# Patient Record
Sex: Male | Born: 1965 | Race: White | Hispanic: No | Marital: Married | Smoking: Former smoker
Health system: Southern US, Community
[De-identification: ages and names within clinical notes are randomized; demographics above are authoritative.]

## PROBLEM LIST (undated history)

## (undated) DIAGNOSIS — N400 Enlarged prostate without lower urinary tract symptoms: Secondary | ICD-10-CM

## (undated) HISTORY — DX: Benign prostatic hyperplasia without lower urinary tract symptoms: N40.0

---

## 2004-08-12 HISTORY — PX: APPENDECTOMY: SHX54

## 2005-03-12 ENCOUNTER — Ambulatory Visit: Payer: Self-pay | Admitting: General Surgery

## 2005-03-14 ENCOUNTER — Inpatient Hospital Stay: Payer: Self-pay | Admitting: General Surgery

## 2006-08-08 IMAGING — CR DG ABDOMEN 3V
1 series · 3 of 3 positions shown · non-contrast
Comparison: none

REASON FOR EXAM: Abdominal distention
COMMENTS:

[Series 1: view not recorded · 0.17mm/px · 3 of 3 slices shown]
[im 1/3]
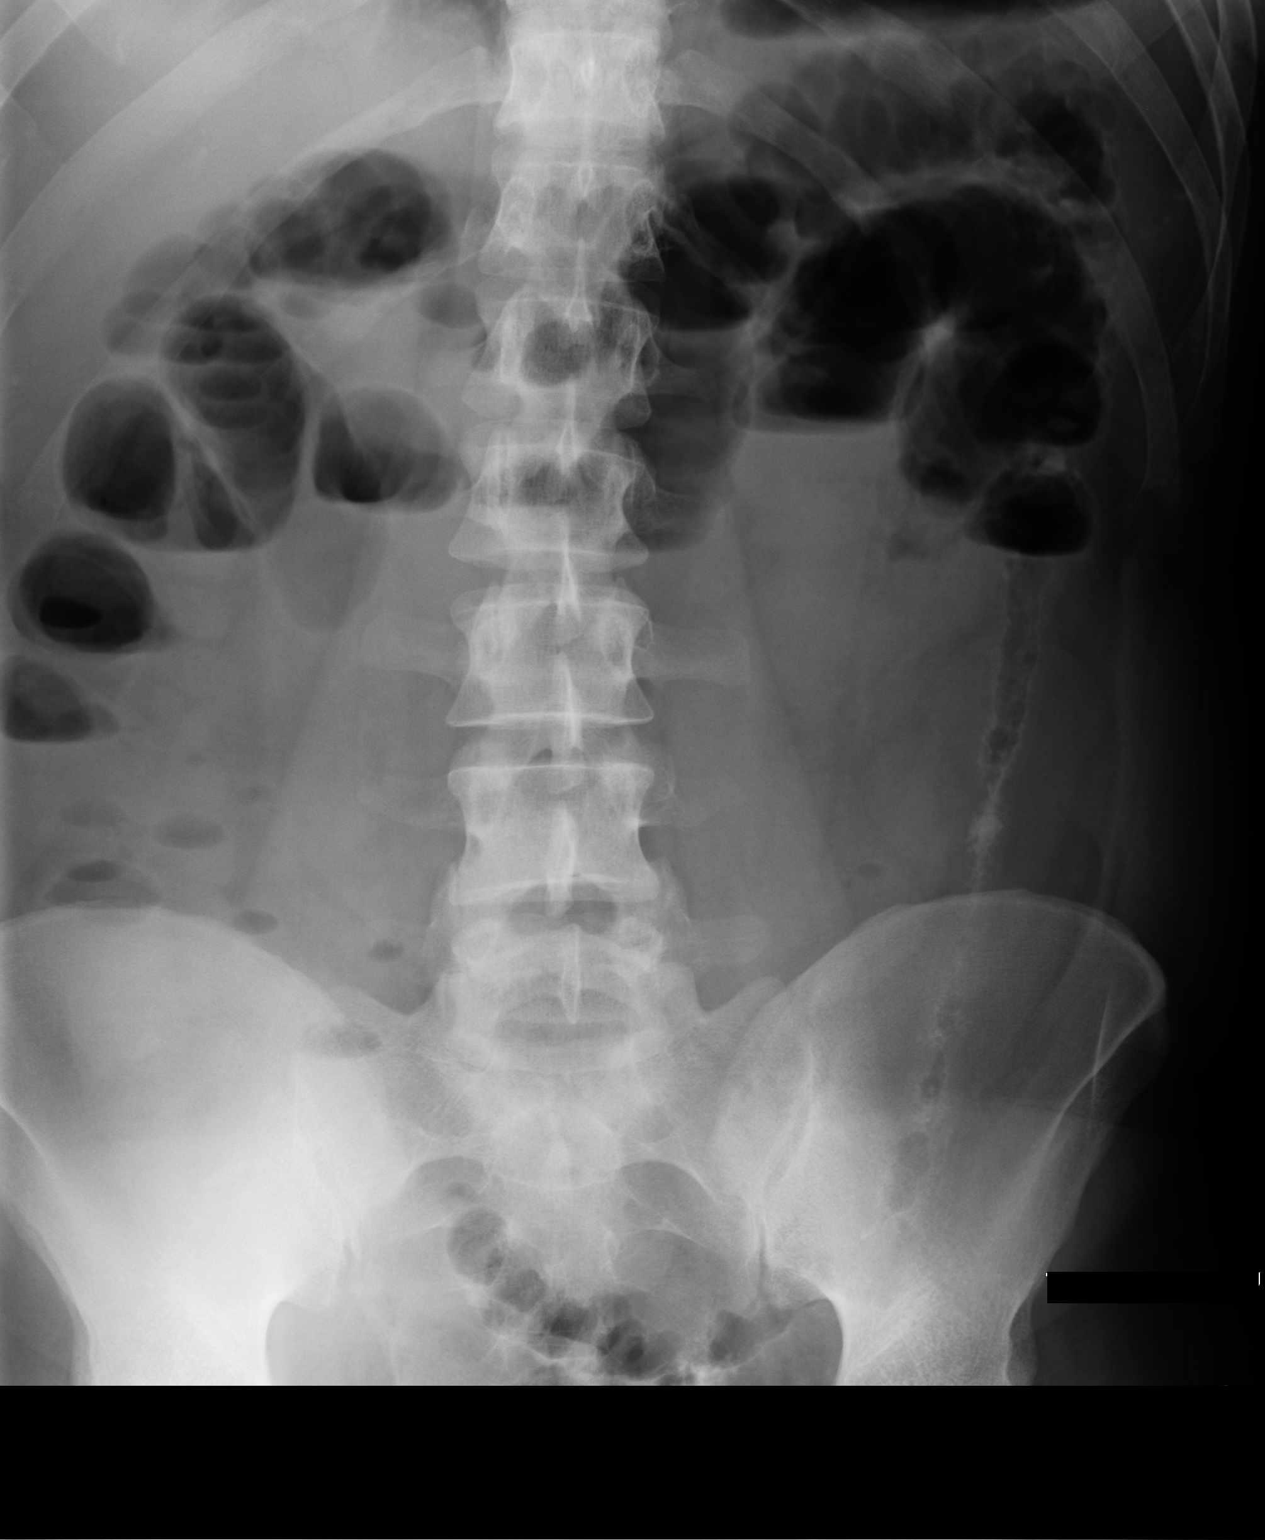
[im 2/3]
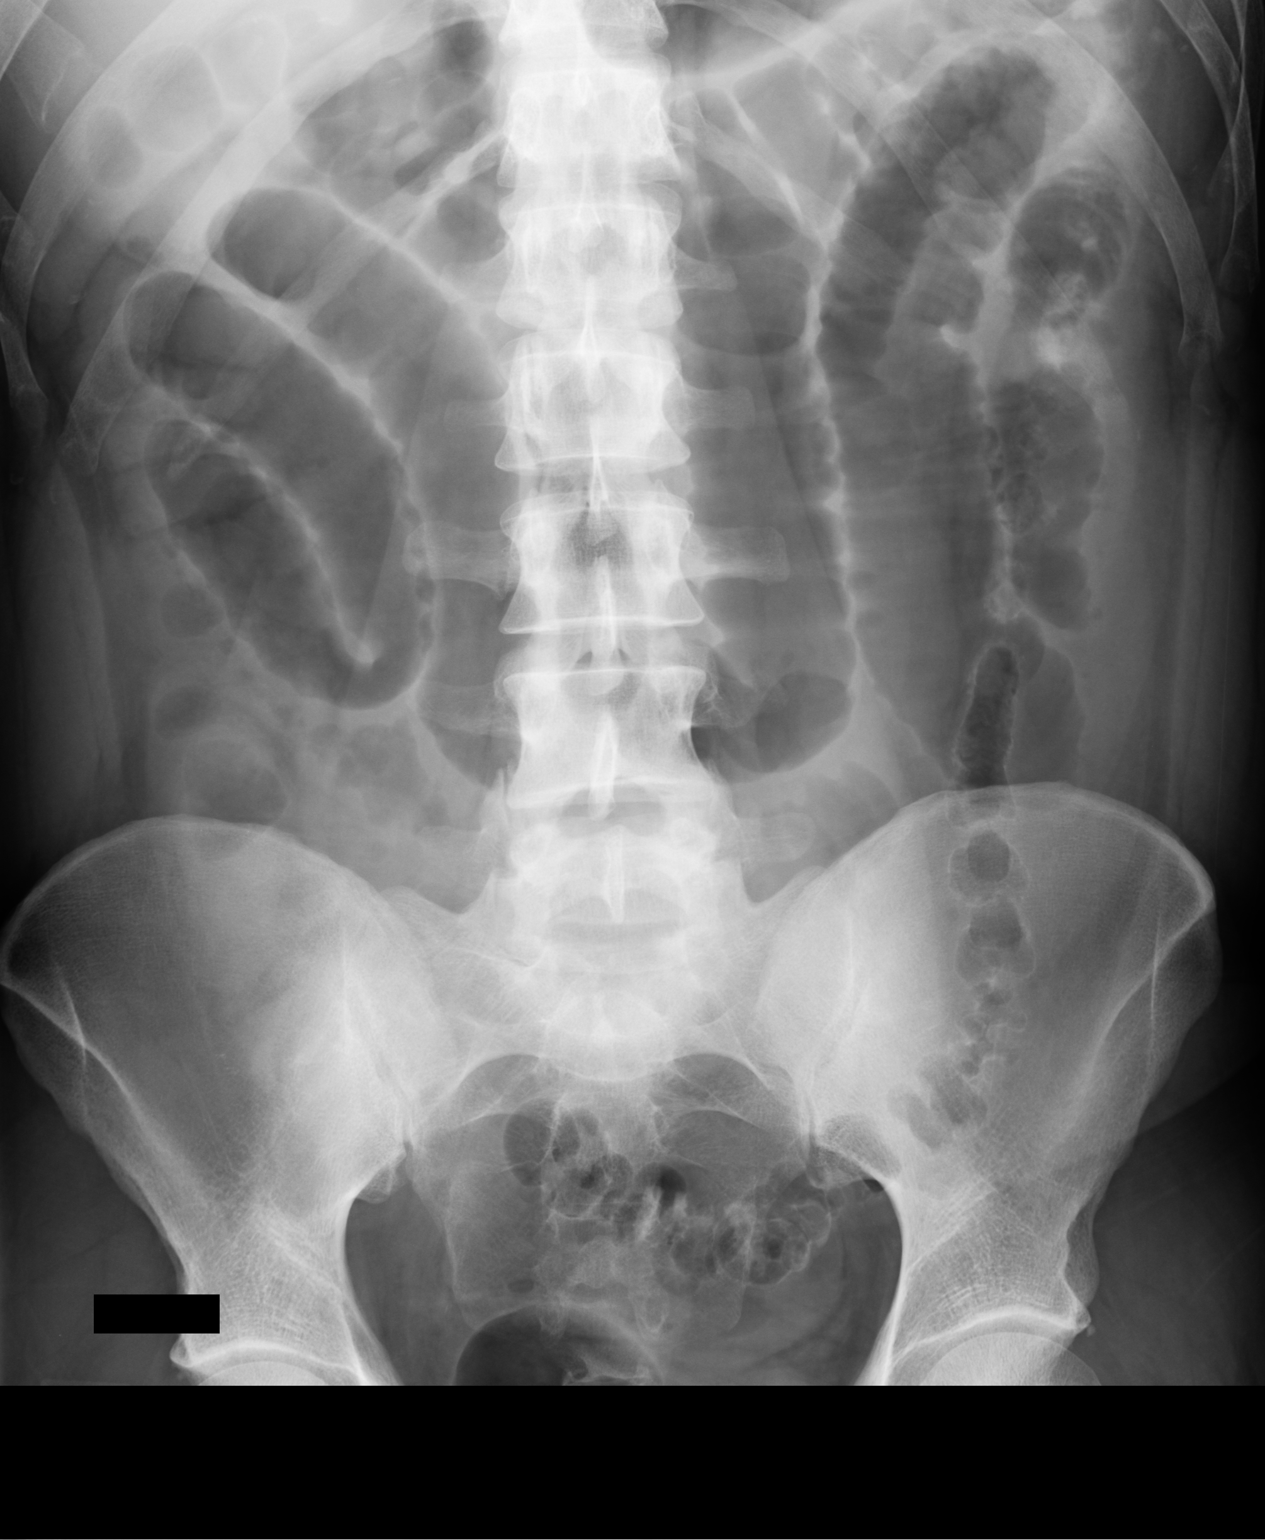
[im 3/3]
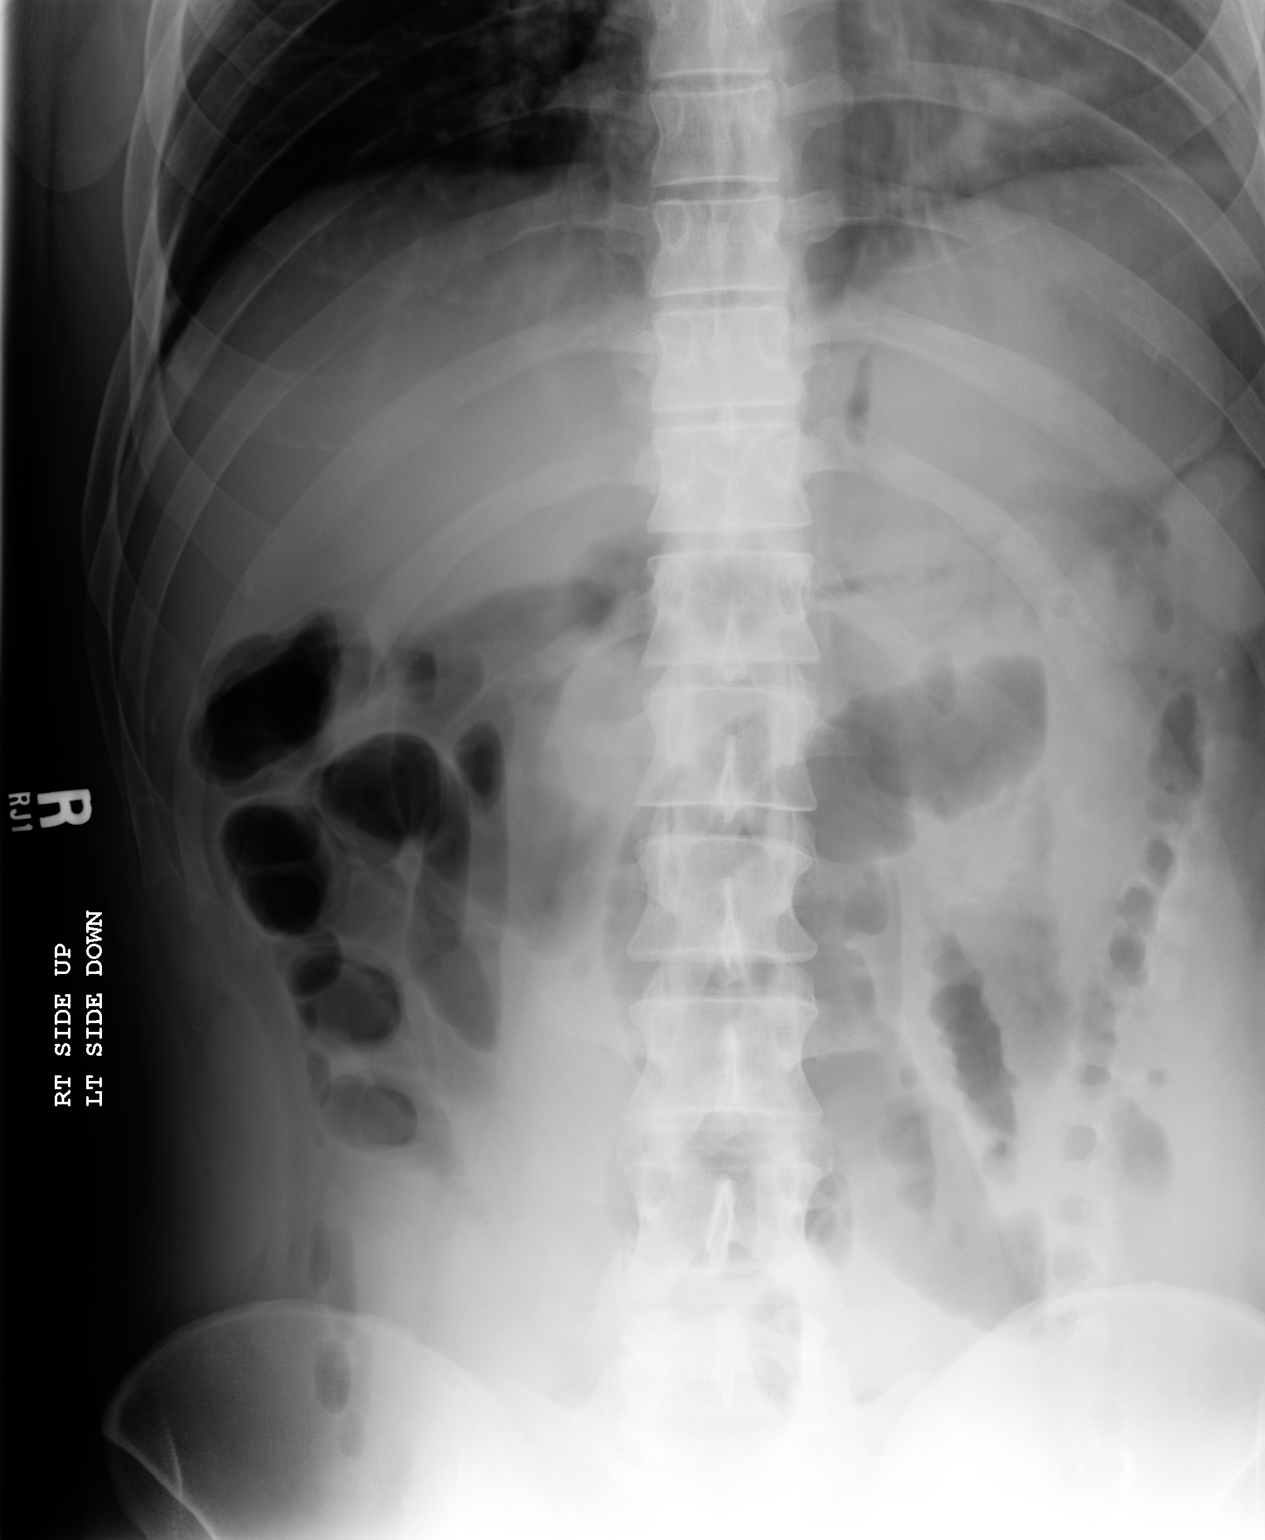

[3 of 3 positions shown; findings below may reference images not displayed]

PROCEDURE:     DXR - DXR ABDOMEN COMPLETE  - March 14, 2005 [DATE]

RESULT:     Flat and upright views of the abdomen were obtained.  There are
multiple mildly dilated loops of small bowel containing air-fluid levels.
Gas is visualized in the colon.  The findings observed are most compatible
with a small bowel ileus.  No definite subdiaphragmatic free air is seen
even though reportedly the patient has had recent abdominal surgery.  No
abnormal intraabdominal calcifications are noted.  The psoas margins are
bilaterally sharp.  No acute bony abnormalities are seen.
IMPRESSION: There are scattered air-fluid levels in loops of small bowel compatible with
an ileus pattern.

## 2015-08-04 ENCOUNTER — Encounter: Payer: Self-pay | Admitting: Physician Assistant

## 2015-08-04 ENCOUNTER — Ambulatory Visit: Payer: Self-pay | Admitting: Physician Assistant

## 2015-08-04 VITALS — BP 140/90 | HR 90 | Temp 98.0°F

## 2015-08-04 DIAGNOSIS — R1900 Intra-abdominal and pelvic swelling, mass and lump, unspecified site: Secondary | ICD-10-CM

## 2015-08-04 NOTE — Progress Notes (Signed)
S: noticed a lump on his left side, not painful, feels like its gotten a little smaller than when he first noticed it, former smoker, stopped 5 years ago, mother had several lipomas on trunk area  O: vitals wnl, nad, left ribs nontender, area below ribs on lateral left side with small ?lipoma, no redness drainage, no entrapment, n/v intact  A: lump on left side below rib  P: f/u with wurgery for eval, most likely just a lipoma

## 2015-08-15 ENCOUNTER — Encounter: Payer: Self-pay | Admitting: General Surgery

## 2015-08-15 ENCOUNTER — Ambulatory Visit (INDEPENDENT_AMBULATORY_CARE_PROVIDER_SITE_OTHER): Payer: Managed Care, Other (non HMO) | Admitting: General Surgery

## 2015-08-15 VITALS — BP 152/98 | HR 88 | Resp 16 | Ht 72.0 in | Wt 238.0 lb

## 2015-08-15 DIAGNOSIS — D179 Benign lipomatous neoplasm, unspecified: Secondary | ICD-10-CM | POA: Diagnosis not present

## 2015-08-15 NOTE — Progress Notes (Signed)
Patient ID: Eddie Bishop, male   DOB: 03/01/1966, 50 y.o.   MRN: [id number]  Chief Complaint  Patient presents with  . Other    HPI Eddie Bishop is a 50 y.o. male.  Here today for evaluation of left flank knot. He noticed it one day duck hunting. He states it has been there about 1 month. He has lost 15 pounds in the last 2-3 months. He denies pain and states it has not changed in size.  I personally reviewed the patient's history.  HPI  No past medical history on file.  Past Surgical History  Procedure Laterality Date  . Appendectomy  2006    postop ileus    No family history on file.  Social History Social History  Substance Use Topics  . Smoking status: Former Smoker    Quit date: 06/04/2011  . Smokeless tobacco: Never Used  . Alcohol Use: 0.0 oz/week    0 Standard drinks or equivalent per week     Comment: occasionally    No Known Allergies  Current Outpatient Prescriptions  Medication Sig Dispense Refill  . ibuprofen (ADVIL,MOTRIN) 200 MG tablet Take 200 mg by mouth every 6 (six) hours as needed.     No current facility-administered medications for this visit.    Review of Systems Review of Systems  Blood pressure 152/98, pulse 88, resp. rate 16, height 6' (1.829 m), weight 238 lb (107.956 kg).  Physical Exam Physical Exam  Constitutional: He is oriented to person, place, and time. He appears well-developed and well-nourished.  HENT:  Mouth/Throat: Oropharynx is clear and moist.  Abdominal:    Neurological: He is alert and oriented to person, place, and time.  Skin: Skin is warm and dry.  3 cm lipoma left mid axillary line at T 11.  Psychiatric: His behavior is normal.    Data Reviewed None  Assessment    Asymptomatic left lower chest wall lipoma.    Plan    No intervention required at this time. Excision under local anesthesia if it enlarged, become symptomatic or his producing any anxiety.    Follow up if symptoms worsen or  change. The patient is aware to call back for any questions or concerns.   PCP/Ref:  Versie Starks This information has been scribed by Karie Fetch [place].    Robert Bellow 08/15/2015, 8:16 PM

## 2015-08-15 NOTE — Patient Instructions (Addendum)
The patient is aware to call back for any questions or concerns. Follow up if symptom worsen or change

## 2016-03-13 ENCOUNTER — Ambulatory Visit: Payer: Self-pay | Admitting: Physician Assistant

## 2016-03-13 VITALS — BP 139/80 | HR 83 | Temp 97.7°F

## 2016-03-13 DIAGNOSIS — M25562 Pain in left knee: Secondary | ICD-10-CM

## 2016-03-13 NOTE — Progress Notes (Signed)
S: c/o knee pain, left knee has grinding and soreness along lateral aspect, no loss of motion, some discomfort with squatting, just wants to get ahead of the ball as his mother had degenerative joints and had to have both knees replaced, no known injury, used ibuprofen for awhile but did not want to keep taking nsaids forever, has a knee brace that helps a little, remainder ros neg  O: vitals wnl, nad, skin intact, no bruising or redness noted, left knee is neg for bony tenderness, crepitus noted along lateral aspect of patella, ligaments appear intact, n/v intact, walks without a limp  A: knee pain  P: refer to sports med, explained home exercises to strengthen quads, otc turmeric

## 2016-03-19 NOTE — Progress Notes (Signed)
Patient ID: Eddie Bishop, male   DOB: 11/16/1965, 50 y.o.   MRN: [id number] Patient has been referred to Dr. Hulan Saas at [facility] in [place].  Appointment is 8/16/207 @ 10:30. I left a message on the patients voicemail informing him of his appointment.

## 2016-03-26 NOTE — Progress Notes (Signed)
[name] [address] [place], [place] [address] Phone: [phone] Subjective:    I'm seeing this patient by the request  of:  Ashok Cordia, PA-C   CC: Left knee pain  [id number]  Eddie Bishop is a 50 y.o. male coming in with complaint of left knee pain. Patient has been complaining more of a grinding sensation that seems to be on the lateral aspect. Worse with flexing of the knee such as squatting or going upstairs. Patient states that ibuprofen does help but does not completely giving away. Also has tried bracing with very mild improvement. Patient has lost significant amount weight and has been doing most of this with walking. Patient states that maybe some swelling. Never feels like is going to give out. Denies any radiation of pain. Rates the severity of pain is 4 out of 10. Does keep him from running no. 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     No past medical history on file. Past Surgical History:  Procedure Laterality Date  . APPENDECTOMY  2006   postop ileus   Social History   Social History  . Marital status: Married    Spouse name: N/A  . Number of children: N/A  . Years of education: N/A   Social History Main Topics  . Smoking status: Former Smoker    Quit date: 06/04/2011  . Smokeless tobacco: Never Used  . Alcohol use 0.0 oz/week     Comment: occasionally  . Drug use: No  . Sexual activity: Not Asked   Other Topics Concern  . None   Social History Narrative  . None   No Known Allergies No family history on file.  Past medical history, social, surgical and family history all reviewed in electronic medical record.  No pertanent information unless stated regarding to the chief complaint.   Review of Systems: No headache, visual changes, nausea, vomiting, diarrhea, constipation, dizziness, abdominal pain, skin rash, fevers, chills, night sweats,  weight loss, swollen lymph nodes, body aches, joint swelling, muscle aches, chest pain, shortness of breath, mood changes.   Objective  Blood pressure 138/88, pulse (!) 103, height 6' (1.829 m), weight 229 lb (103.9 kg), SpO2 97 %.  General: No apparent distress alert and oriented x3 mood and affect normal, dressed appropriately.  HEENT: Pupils equal, extraocular movements intact  Respiratory: Patient's speak in full sentences and does not appear short of breath  Cardiovascular: No lower extremity edema, non tender, no erythema  Skin: Warm dry intact with no signs of infection or rash on extremities or on axial skeleton.  Abdomen: Soft nontender  Neuro: Cranial nerves II through XII are intact, neurovascularly intact in all extremities with 2+ DTRs and 2+ pulses.  Lymph: No lymphadenopathy of posterior or anterior cervical chain or axillae bilaterally.  Gait normal with good balance and coordination.  MSK:  Non tender with full range of motion and good stability and symmetric strength and tone of shoulders, elbows, wrist, hip, and ankles bilaterally.  Knee: Left Normal to inspection with no erythema or effusion or obvious bony abnormalities. Very mild lateral tilt of the patella Palpation normal with no warmth, joint line tenderness, patellar tenderness, or condyle tenderness. ROM full in flexion and extension and lower leg rotation. Ligaments with solid consistent endpoints including ACL, PCL, LCL, MCL. Negative Mcmurray's, Apley's, and Thessalonian tests. Mild painful patellar compression. Patellar glide  with mild crepitus. Patellar and quadriceps tendons unremarkable. Hamstring and quadriceps strength is normal.   MSK US performed of: Left This study was ordered, performed, and interpreted by Charlann Boxer D.O.  Knee: All structures visualized. Trace effusion of the superior lateral patellofemoral space. Anteromedial, anterolateral, posteromedial, and posterolateral menisci  unremarkable without tearing, fraying, effusion, or displacement. Patellar Tendon unremarkable on long and transverse views without effusion. No abnormality of prepatellar bursa. LCL and MCL unremarkable on long and transverse views. No abnormality of origin of medial or lateral head of the gastrocnemius.  IMPRESSION:  Mild patellofemoral arthritis     Impression and Recommendations:     This case required medical decision making of moderate complexity.      Note: This dictation was prepared with Dragon dictation along with smaller phrase technology. Any transcriptional errors that result from this process are unintentional.

## 2016-03-27 ENCOUNTER — Other Ambulatory Visit: Payer: Self-pay

## 2016-03-27 ENCOUNTER — Encounter: Payer: Self-pay | Admitting: Family Medicine

## 2016-03-27 ENCOUNTER — Ambulatory Visit (INDEPENDENT_AMBULATORY_CARE_PROVIDER_SITE_OTHER): Payer: No Typology Code available for payment source | Admitting: Family Medicine

## 2016-03-27 VITALS — BP 138/88 | HR 103 | Ht 72.0 in | Wt 229.0 lb

## 2016-03-27 DIAGNOSIS — M13162 Monoarthritis, not elsewhere classified, left knee: Secondary | ICD-10-CM

## 2016-03-27 DIAGNOSIS — M25562 Pain in left knee: Secondary | ICD-10-CM

## 2016-03-27 DIAGNOSIS — M1712 Unilateral primary osteoarthritis, left knee: Secondary | ICD-10-CM

## 2016-03-27 MED ORDER — DICLOFENAC SODIUM 2 % TD SOLN: 2.0000 "application " | Freq: Two times a day (BID) | TRANSDERMAL | 3 refills | Status: AC

## 2016-03-27 NOTE — Assessment & Plan Note (Signed)
Patient does have more of a patellofemoral arthritis. I do think the patient may have had a patellar subluxation ago. 6 tolerated compared to the contralateral side. Patient even home exercises, topical anti-inflammatories, we discussed over-the-counter medications. We discussed proper shoes and icing regimen. Patient will come back and see me again in 4 weeks for further evaluation and treatment.

## 2016-03-27 NOTE — Patient Instructions (Signed)
Great to meet you  Ice 20 minutes 2 times daily. Usually after activity and before bed. Exercises 3 times a week.  pennsaid pinkie amount topically 2 times daily as needed.  Vitamin D 2000 IU daily  Turmeric 500mg  twice daily  Eat within 30 minutes of working out and would consider whey protein isolate or [employer] sport.  Cross train with biking and or elliptical instead of walking when you can.  This will be less on your knee.  See me again in 3-4 weeks to make sure you are doing well.

## 2016-04-23 ENCOUNTER — Ambulatory Visit: Payer: Self-pay | Admitting: Family Medicine

## 2016-07-01 ENCOUNTER — Ambulatory Visit: Payer: Self-pay | Admitting: Physician Assistant

## 2016-07-01 DIAGNOSIS — Z299 Encounter for prophylactic measures, unspecified: Secondary | ICD-10-CM

## 2016-07-01 NOTE — Progress Notes (Signed)
Patient in office today for Influenza shot pnly given IM right deltoid

## 2016-12-16 ENCOUNTER — Ambulatory Visit: Payer: Self-pay | Admitting: Physician Assistant

## 2016-12-17 ENCOUNTER — Encounter: Payer: Self-pay | Admitting: Physician Assistant

## 2016-12-17 ENCOUNTER — Ambulatory Visit: Payer: Self-pay | Admitting: Physician Assistant

## 2016-12-17 VITALS — BP 138/80 | HR 78 | Temp 98.5°F | Ht 72.0 in | Wt 235.0 lb

## 2016-12-17 DIAGNOSIS — Z008 Encounter for other general examination: Secondary | ICD-10-CM

## 2016-12-17 DIAGNOSIS — Z0189 Encounter for other specified special examinations: Secondary | ICD-10-CM

## 2016-12-17 DIAGNOSIS — Z0000 Encounter for general adult medical examination without abnormal findings: Secondary | ICD-10-CM

## 2016-12-17 NOTE — Progress Notes (Signed)
S: pt here for wellness physical and biometrics for insurance purposes, states gets neck pain if leans to the right too much his hand will go numb at middle finger, no other complaints ros neg. PMH:   bph Social:  Former smoker Fam: father had throat cancer, in remission now, mother has prediabetes, B and S are healthy  O: vitals wnl, nad, ENT wnl, neck supple no lymph, lungs c t a, cv rrr, abd soft nontender bs normal all 4 quads, cspine nontender, full rom, grips = b/l  A: wellness, biometric physical  P: discussed weight loss, f/u during October for repeat labs/PE to get on yearly schedule

## 2016-12-18 LAB — LIPID PANEL
CHOLESTEROL TOTAL: 199 mg/dL (ref 100–199)
Chol/HDL Ratio: 5.2 ratio — ABNORMAL HIGH (ref 0.0–5.0)
HDL: 38 mg/dL — ABNORMAL LOW (ref >39)
LDL CALC: 87 mg/dL (ref 0–99)
Triglycerides: 368 mg/dL — ABNORMAL HIGH (ref 0–149)
VLDL Cholesterol Cal: 74 mg/dL — ABNORMAL HIGH (ref 5–40)

## 2016-12-18 LAB — GLUCOSE, RANDOM: Glucose: 105 mg/dL — ABNORMAL HIGH (ref 65–99)

## 2017-02-17 ENCOUNTER — Ambulatory Visit: Payer: Self-pay | Admitting: Physician Assistant

## 2017-02-17 ENCOUNTER — Encounter: Payer: Self-pay | Admitting: Physician Assistant

## 2017-02-17 VITALS — BP 139/90 | HR 71 | Temp 98.5°F | Resp 16

## 2017-02-17 DIAGNOSIS — M5412 Radiculopathy, cervical region: Secondary | ICD-10-CM

## 2017-02-17 MED ORDER — MELOXICAM 15 MG PO TABS: 15.0000 mg | ORAL_TABLET | Freq: Every day | ORAL | 3 refills | Status: AC

## 2017-02-17 NOTE — Progress Notes (Signed)
S: c/o neck pain that radiates down left arm to left hand, feels tingling in middle and ring finger, no known injury, states he can get in a different position and the sx will go away but they return, having to sleep on several pillows to keep head and neck adjusted, states he used a u shaped pillow while watching tv which helped, sx on and off for a while, getting more freq  O: vitals wnl, nad, cspine is not tender, decreased rom with hyperextension, good rotation to left and right, trapezious is spasmed b/l, grips = b/l, n/v intact  A: acute cervical radiculopathy  P: mobic 15mg  qd, refer to ortho for eval

## 2017-03-27 ENCOUNTER — Other Ambulatory Visit: Payer: Self-pay | Admitting: Physician Assistant

## 2017-03-27 DIAGNOSIS — M5412 Radiculopathy, cervical region: Secondary | ICD-10-CM

## 2017-04-02 ENCOUNTER — Ambulatory Visit
Admission: RE | Admit: 2017-04-02 | Discharge: 2017-04-02 | Disposition: A | Discharge status: Home / Self Care | Payer: Managed Care, Other (non HMO) | Source: Ambulatory Visit | Attending: Physician Assistant | Admitting: Physician Assistant

## 2017-07-01 ENCOUNTER — Ambulatory Visit: Payer: Self-pay | Admitting: Physician Assistant

## 2017-07-01 ENCOUNTER — Encounter: Payer: Self-pay | Admitting: Physician Assistant

## 2017-07-01 VITALS — BP 130/80 | HR 78 | Temp 98.5°F | Resp 16

## 2017-07-01 DIAGNOSIS — Z299 Encounter for prophylactic measures, unspecified: Secondary | ICD-10-CM

## 2017-07-01 NOTE — Progress Notes (Signed)
S: here for flu vaccine and bp concerns, states his blood pressure is been 140/90 at home, he has used a over-the-counter blood pressure cuff, is not sure of the cuff size, states it was really high when he was at the urologist, and when he had an MRI. Today his blood pressure is better but he has a lot of anxiety about it  O: Vitals with a BP of 130/80, lungs are clear to auscultation, heart sounds are normal  A: Elevated blood pressure reading without history of hypertension  P: Discussed calibrating his home blood pressure cuff, recheck blood pressure here, take over-the-counter cinnamon to naturally lower the blood pressure. Return if worsening

## 2017-08-01 ENCOUNTER — Ambulatory Visit: Payer: Self-pay

## 2017-08-13 ENCOUNTER — Other Ambulatory Visit: Payer: Self-pay

## 2017-08-13 VITALS — BP 150/90 | HR 81 | Temp 98.5°F | Resp 16

## 2017-08-13 DIAGNOSIS — Z299 Encounter for prophylactic measures, unspecified: Secondary | ICD-10-CM

## 2017-08-13 NOTE — Progress Notes (Signed)
Patient came in to have his yearly labs.

## 2017-08-14 LAB — CMP12+LP+TP+TSH+6AC+PSA+CBC…
A/G RATIO: 1.9 (ref 1.2–2.2)
ALT: 40 IU/L (ref 0–44)
AST: 24 IU/L (ref 0–40)
Albumin: 4.9 g/dL (ref 3.5–5.5)
Alkaline Phosphatase: 90 IU/L (ref 39–117)
BILIRUBIN TOTAL: 0.5 mg/dL (ref 0.0–1.2)
BUN / CREAT RATIO: 14 (ref 9–20)
BUN: 15 mg/dL (ref 6–24)
Basophils Absolute: 0 10*3/uL (ref 0.0–0.2)
Basos: 1 %
CHOLESTEROL TOTAL: 240 mg/dL — AB (ref 100–199)
CREATININE: 1.05 mg/dL (ref 0.76–1.27)
Calcium: 9.7 mg/dL (ref 8.7–10.2)
Chloride: 98 mmol/L (ref 96–106)
Chol/HDL Ratio: 4.4 ratio (ref 0.0–5.0)
EOS (ABSOLUTE): 0.1 10*3/uL (ref 0.0–0.4)
Eos: 2 %
Estimated CHD Risk: 0.9 times avg. (ref 0.0–1.0)
Free Thyroxine Index: 1.8 (ref 1.2–4.9)
GFR calc non Af Amer: 82 mL/min/{1.73_m2} (ref >59)
GFR, EST AFRICAN AMERICAN: 95 mL/min/{1.73_m2} (ref >59)
GGT: 122 IU/L — AB (ref 0–65)
Globulin, Total: 2.6 g/dL (ref 1.5–4.5)
Glucose: 95 mg/dL (ref 65–99)
HDL: 54 mg/dL (ref >39)
Hematocrit: 47.8 % (ref 37.5–51.0)
Hemoglobin: 16.3 g/dL (ref 13.0–17.7)
IMMATURE GRANULOCYTES: 1 %
IRON: 191 ug/dL — AB (ref 38–169)
Immature Grans (Abs): 0 10*3/uL (ref 0.0–0.1)
LDH: 197 IU/L (ref 121–224)
LDL Calculated: 141 mg/dL — ABNORMAL HIGH (ref 0–99)
LYMPHS ABS: 1.7 10*3/uL (ref 0.7–3.1)
LYMPHS: 26 %
MCH: 32.4 pg (ref 26.6–33.0)
MCHC: 34.1 g/dL (ref 31.5–35.7)
MCV: 95 fL (ref 79–97)
Monocytes Absolute: 0.5 10*3/uL (ref 0.1–0.9)
Monocytes: 8 %
NEUTROS ABS: 3.9 10*3/uL (ref 1.4–7.0)
NEUTROS PCT: 62 %
PHOSPHORUS: 2.9 mg/dL (ref 2.5–4.5)
PLATELETS: 252 10*3/uL (ref 150–379)
PROSTATE SPECIFIC AG, SERUM: 3.9 ng/mL (ref 0.0–4.0)
Potassium: 4.8 mmol/L (ref 3.5–5.2)
RBC: 5.03 x10E6/uL (ref 4.14–5.80)
RDW: 13.8 % (ref 12.3–15.4)
SODIUM: 140 mmol/L (ref 134–144)
T3 UPTAKE RATIO: 27 % (ref 24–39)
T4 TOTAL: 6.5 ug/dL (ref 4.5–12.0)
TOTAL PROTEIN: 7.5 g/dL (ref 6.0–8.5)
TSH: 1.52 u[IU]/mL (ref 0.450–4.500)
Triglycerides: 223 mg/dL — ABNORMAL HIGH (ref 0–149)
Uric Acid: 7.6 mg/dL (ref 3.7–8.6)
VLDL CHOLESTEROL CAL: 45 mg/dL — AB (ref 5–40)
WBC: 6.3 10*3/uL (ref 3.4–10.8)

## 2017-08-14 LAB — VITAMIN D 25 HYDROXY (VIT D DEFICIENCY, FRACTURES): Vit D, 25-Hydroxy: 29.2 ng/mL — ABNORMAL LOW (ref 30.0–100.0)
